# Patient Record
Sex: Male | Born: 1970 | Race: Black or African American | Hispanic: No | Marital: Married | Smoking: Never smoker
Health system: Southern US, Community
[De-identification: ages and names within clinical notes are randomized; demographics above are authoritative.]

## PROBLEM LIST (undated history)

## (undated) DIAGNOSIS — F329 Major depressive disorder, single episode, unspecified: Secondary | ICD-10-CM

## (undated) DIAGNOSIS — J309 Allergic rhinitis, unspecified: Secondary | ICD-10-CM

## (undated) DIAGNOSIS — F32A Depression, unspecified: Secondary | ICD-10-CM

## (undated) DIAGNOSIS — E781 Pure hyperglyceridemia: Secondary | ICD-10-CM

## (undated) HISTORY — DX: Allergic rhinitis, unspecified: J30.9

## (undated) HISTORY — DX: Pure hyperglyceridemia: E78.1

## (undated) HISTORY — DX: Major depressive disorder, single episode, unspecified: F32.9

## (undated) HISTORY — DX: Depression, unspecified: F32.A

---

## 2008-12-31 ENCOUNTER — Ambulatory Visit: Payer: Self-pay | Admitting: Orthopedic Surgery

## 2009-02-17 ENCOUNTER — Ambulatory Visit: Payer: Self-pay | Admitting: Orthopedic Surgery

## 2009-02-24 ENCOUNTER — Ambulatory Visit: Payer: Self-pay | Admitting: Orthopedic Surgery

## 2009-07-31 HISTORY — PX: SHOULDER SURGERY: SHX246

## 2011-09-03 ENCOUNTER — Ambulatory Visit: Payer: Self-pay

## 2013-06-01 ENCOUNTER — Encounter (HOSPITAL_COMMUNITY): Payer: Self-pay | Admitting: Emergency Medicine

## 2013-06-01 ENCOUNTER — Emergency Department (INDEPENDENT_AMBULATORY_CARE_PROVIDER_SITE_OTHER)
Admission: EM | Admit: 2013-06-01 | Discharge: 2013-06-01 | Disposition: A | Discharge status: Home / Self Care | Payer: 59 | Source: Home / Self Care | Attending: Emergency Medicine | Admitting: Emergency Medicine

## 2013-06-01 DIAGNOSIS — H579 Unspecified disorder of eye and adnexa: Secondary | ICD-10-CM

## 2013-06-01 MED ORDER — POLYMYXIN B-TRIMETHOPRIM 10000-0.1 UNIT/ML-% OP SOLN: 1.0000 [drp] | OPHTHALMIC | Status: DC

## 2013-06-01 MED ORDER — TETRACAINE HCL 0.5 % OP SOLN
OPHTHALMIC | Status: AC
  Filled 2013-06-01: qty 2

## 2013-06-01 NOTE — ED Notes (Signed)
Pt  Reports        r  Eye  Irritated          For  sev  Days           The    Eye  Is       Irritated       Red   And  Watery

## 2013-06-01 NOTE — ED Provider Notes (Signed)
Chief Complaint:   Chief Complaint  Patient presents with  . 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 Ever since then it's been red, watering, irritated, and he feels a sensation of a foreign body. He denies any change in his vision. There's been no purulent drainage.  Review of Systems:  Other than noted above, the patient denies any of the following symptoms: Systemic:  No fever, chills, sweats, fatigue, or weight loss. Eye:  No redness, eye pain, photophobia, discharge, blurred vision, or diplopia. ENT:  No nasal congestion, rhinorrhea, or sore throat. Lymphatic:  No adenopathy. Skin:  No rash or pruritis.  PMFSH:  Past medical history, family history, social history, meds, and allergies were reviewed.   Physical Exam:   Vital signs:  BP 142/97  Pulse 69  Temp(Src) 98.1 F (36.7 C) (Oral)  Resp 19  SpO2 97% General:  Alert and in no distress. Eye:  His eye lids were normal. Conjunctival sac was searched for foreign body none was found. The upper lid was everted and this was normal. Conjunctiva was injected. There was copious clear drainage, but no purulent drainage. The cornea was intact to gross inspection and to fluorescein staining. Anterior chamber was normal. PERRLA, full EOMs, fundi were benign. ENT:  TMs and canals clear.  Nasal mucosa normal.  No intra-oral lesions, mucous membranes moist, pharynx clear. Neck:  No adenopathy tenderness or mass. Skin:  Clear, warm and dry.  Assessment:  The encounter diagnosis was Sensation of foreign body in eye.  I think he may have had a foreign body in his eye but appears flushed it out. At this point the eyes irritated. Will treat with eye drops, moist warm compresses, and followup with his ophthalmologist if no better in 48 hours.  Plan:   1.  Meds:  The following meds were  prescribed:   Discharge Medication List as of 06/01/2013  9:33 AM    START taking these medications   Details  trimethoprim-polymyxin b (POLYTRIM) ophthalmic solution Place 1 drop into the right eye every 4 (four) hours., Starting 06/01/2013, Until Discontinued, Normal        2.  Patient Education/Counseling:  The patient was given appropriate handouts, self care instructions, and instructed in symptomatic relief.  Advised to avoid rubbing the eyes, avoid eye strain, and use sunglasses to avoid bright sunlight.  3.  Follow up:  The patient was told to follow up if no better in 2 days, if becoming worse in any way, and given some red flag symptoms such as any changes in vision which would prompt immediate return.  Follow up with his ophthalmologist if no better in 48 hours.      Reuben Likes, MD 06/01/13 1041

## 2014-04-16 ENCOUNTER — Ambulatory Visit: Payer: Self-pay | Admitting: Family Medicine

## 2015-05-18 ENCOUNTER — Ambulatory Visit (INDEPENDENT_AMBULATORY_CARE_PROVIDER_SITE_OTHER): Payer: 59 | Admitting: Family Medicine

## 2015-05-18 ENCOUNTER — Encounter: Payer: Self-pay | Admitting: Family Medicine

## 2015-05-18 VITALS — BP 124/88 | HR 70 | Temp 98.8°F | Resp 16 | Ht 71.0 in | Wt 219.8 lb

## 2015-05-18 DIAGNOSIS — E781 Pure hyperglyceridemia: Secondary | ICD-10-CM | POA: Insufficient documentation

## 2015-05-18 DIAGNOSIS — Z87898 Personal history of other specified conditions: Secondary | ICD-10-CM | POA: Diagnosis not present

## 2015-05-18 DIAGNOSIS — K529 Noninfective gastroenteritis and colitis, unspecified: Secondary | ICD-10-CM | POA: Insufficient documentation

## 2015-05-18 DIAGNOSIS — J309 Allergic rhinitis, unspecified: Secondary | ICD-10-CM | POA: Insufficient documentation

## 2015-05-18 NOTE — Progress Notes (Signed)
Subjective:     Patient ID: Aaron Santos, male   DOB: 06/08/1971, 44 y.o.   MRN: [id number]  HPI  Chief Complaint  Patient presents with  . Skin Problem    Patient comes in office today with concerns of nodule/lump on the left side of his neck. Patient repots that he did not notice lump but others have pointed it out to him and it has been present for one week. Patient denies cold/flu like symptoms or injury to neck.   States he has not noticed a lump himself. Denies dental sx but is due a dental exam. He does shave his head daily.   Review of Systems  Constitutional: Negative for fever and chills.       Objective:   Physical Exam  Constitutional: He appears well-developed and well-nourished. No distress.  Neck: No thyromegaly present.  No cysts or masses appreciated.  Lymphadenopathy:    He has no cervical adenopathy.       Assessment:    1. History of neck swelling     Plan:    Monitor. Discussed possibility of reactive lymph nodes or early cyst formation. Will update dental exam.

## 2015-05-18 NOTE — Patient Instructions (Addendum)
Discussed monitoring for further neck swelling or lumps.

## 2015-12-11 IMAGING — CT CT ABD-PELV W/O CM
2 of 4 series · 16 of 46 positions shown, 18 images · non-contrast
Comparison: None.

CLINICAL DATA: Right lower quadrant pain, hematuria

EXAM:
CT ABDOMEN AND PELVIS WITHOUT CONTRAST
TECHNIQUE: Multidetector CT imaging of the abdomen and pelvis was performed
following the standard protocol without IV contrast.

[Series 2: stone standard full · axial · 0.68mm/px · z∈[-892,-432]mm · 13 of 101 slices shown, 15 images]
[im 5/101  soft-tissue]
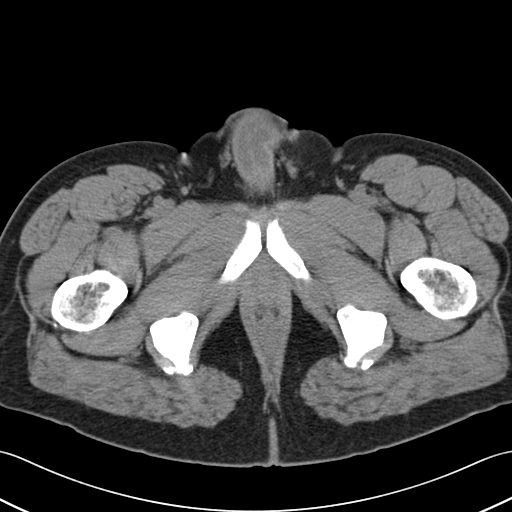
[im 5/101  bone]
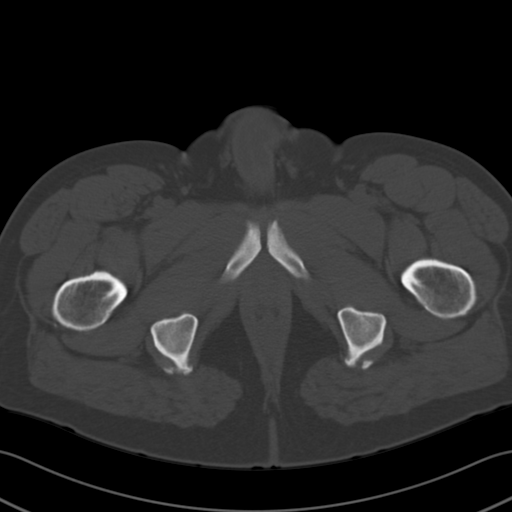
[im 13/101  soft-tissue]
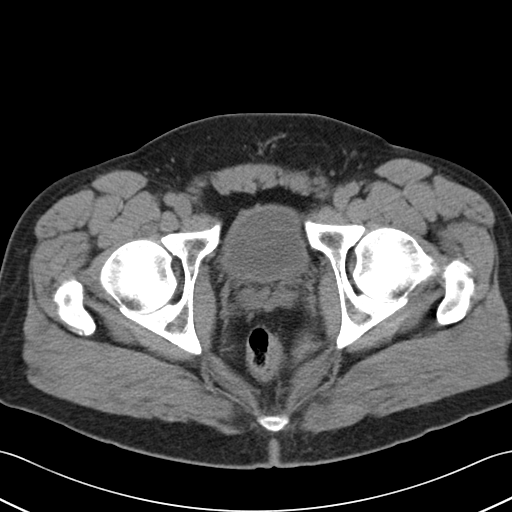
[im 21/101  soft-tissue]
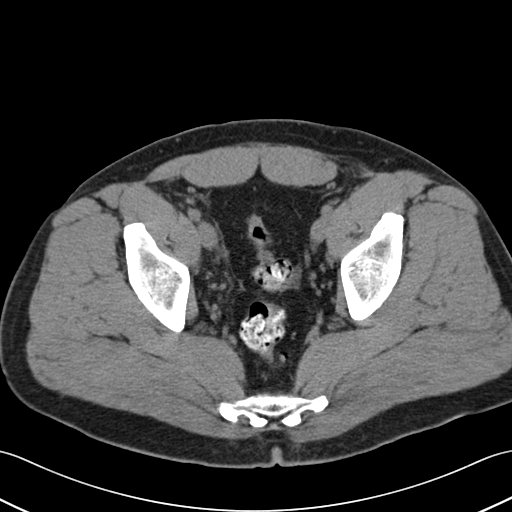
[im 29/101  soft-tissue]
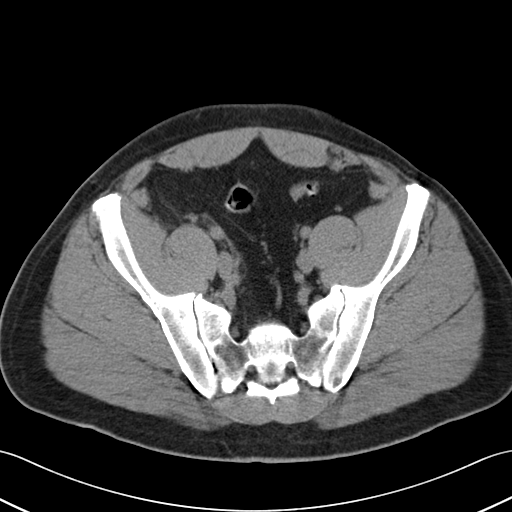
[im 37/101  soft-tissue]
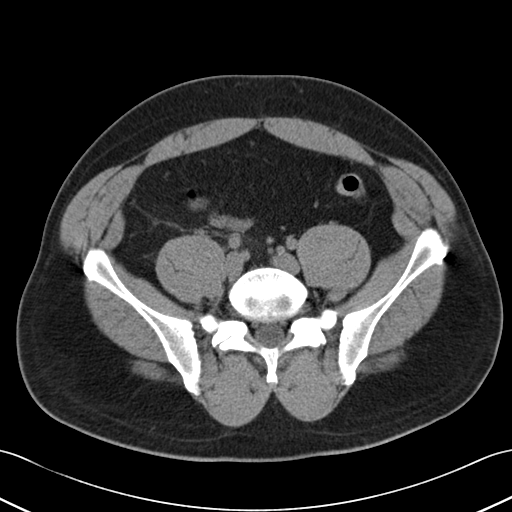
[im 45/101  soft-tissue]
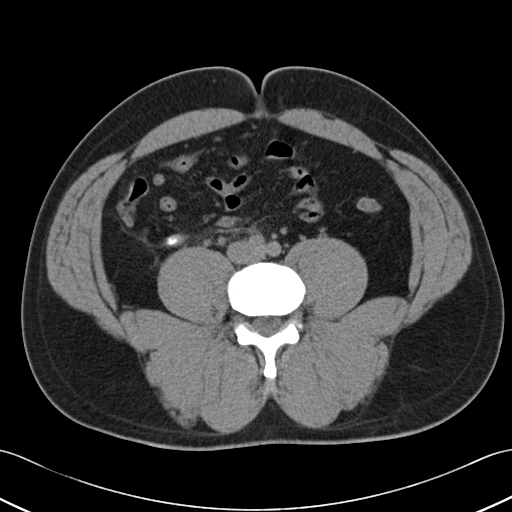
[im 53/101  soft-tissue]
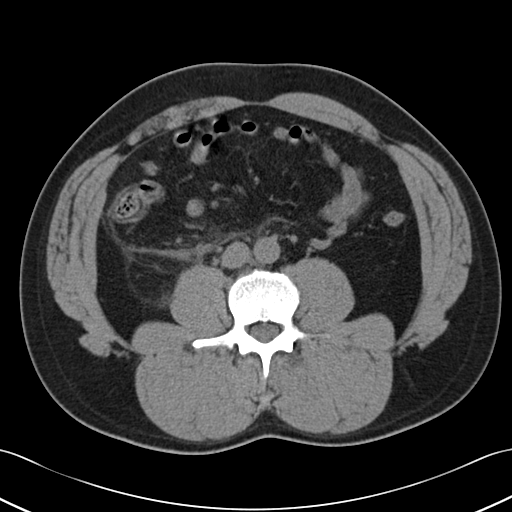
[im 57/101  soft-tissue]
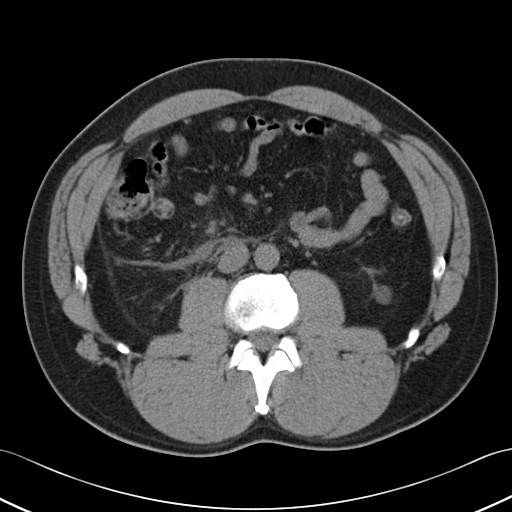
[im 65/101  soft-tissue]
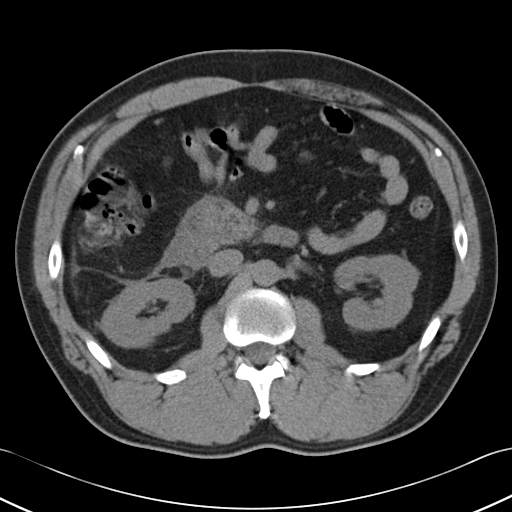
[im 65/101  bone]
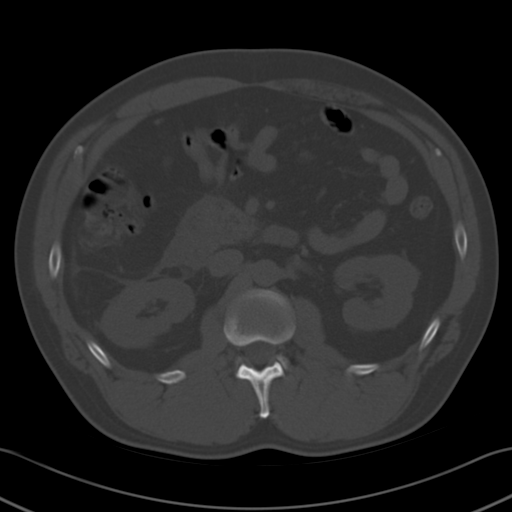
[im 73/101  soft-tissue]
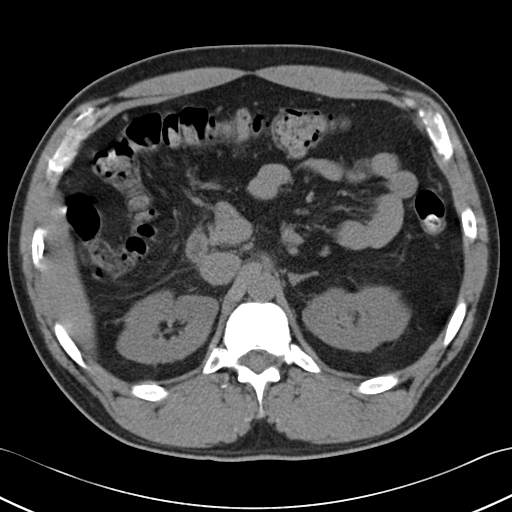
[im 81/101  soft-tissue]
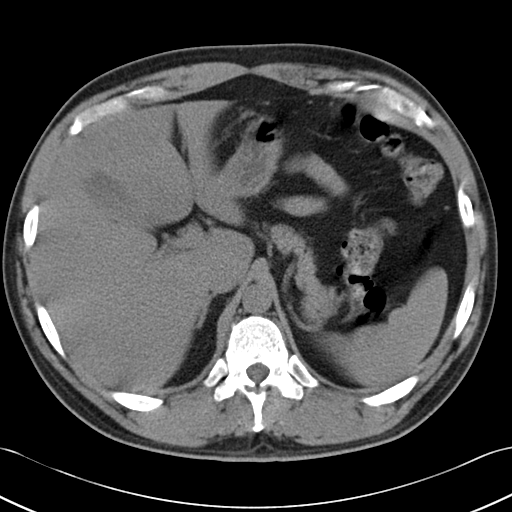
[im 89/101  soft-tissue]
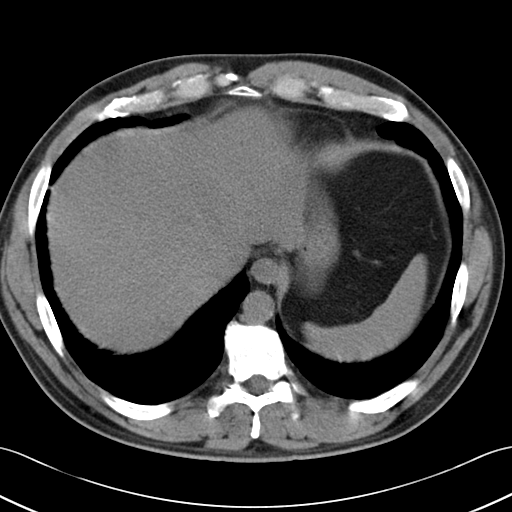
[im 97/101  soft-tissue]
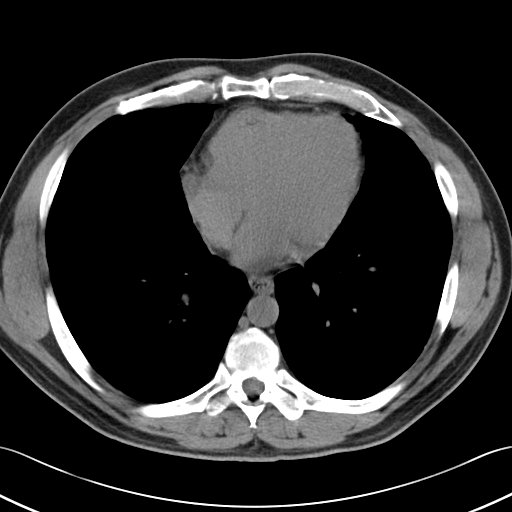

[Series 5: cor stone standard full · coronal · 0.84mm/px · 3 of 137 slices shown]
[im 46/137  soft-tissue]
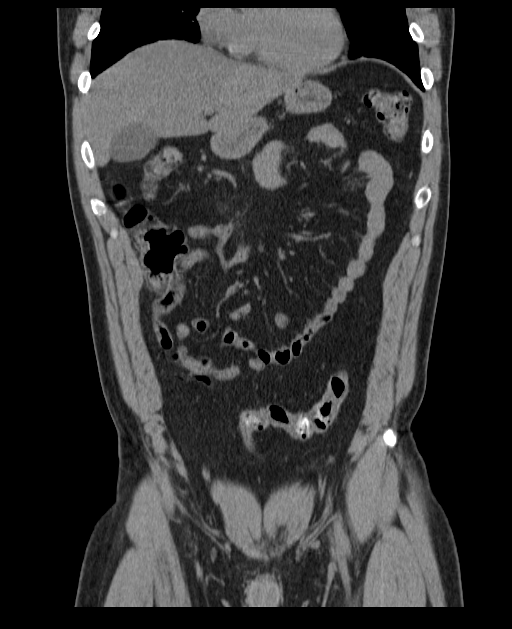
[im 61/137  soft-tissue]
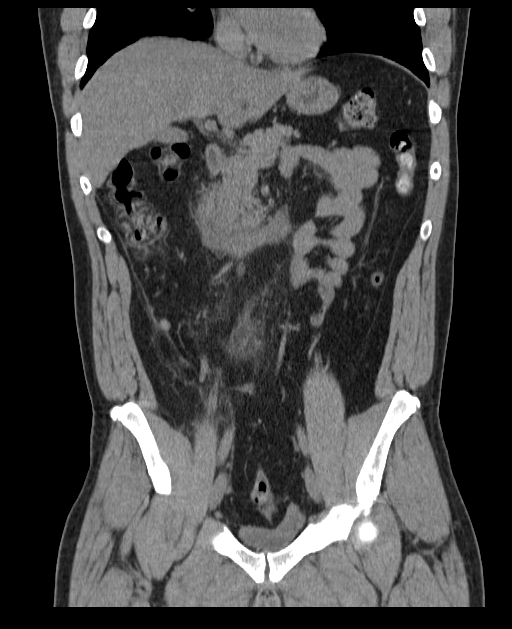
[im 76/137  soft-tissue]
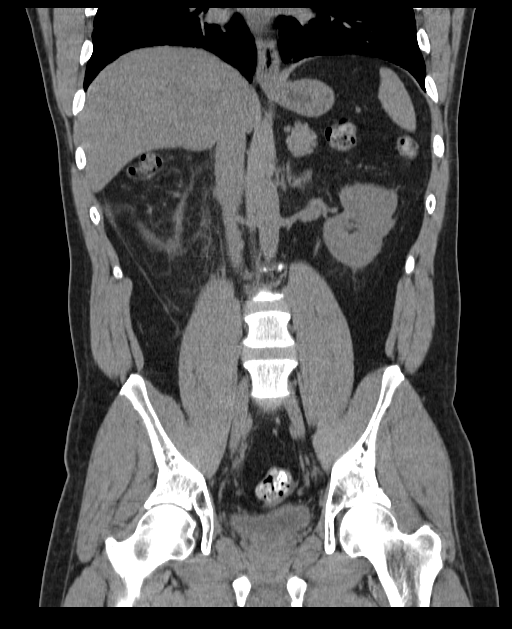

[16 of 46 positions shown; findings below may reference images not displayed]

FINDINGS: Indistinct slightly prominent markings are present in both lung
bases which may be chronic in nature. No focal infiltrate or
effusion is seen. No focal hepatic abnormality is seen. The liver is
slightly low in attenuation which may indicate fatty infiltration.
Correlate with liver function tests. No calcified gallstones are
seen. The pancreas is slightly prominent in the region of the head
of the pancreas and there is some peripancreatic strandiness present
with thickening of the anterior pararenal fascia. These findings
suggest possible mild acute pancreatitis, and correlation with
appropriate laboratory values is recommended. The body and tail of
the pancreas are unremarkable and the pancreatic duct is not
dilated.

No renal calculi are seen and there is no evidence of
hydronephrosis. The ureters are not dilated and no ureteral calculus
is noted. A probable cyst emanates from the lower pole of the left
kidney exophytically. The abdominal aorta is normal in caliber. No
adenopathy is seen.

There is an appendicolith noted within the appendix which does
coarse near the somewhat thickened anterior pararenal fascia.
However the appendix is not dilated and no definite acute
appendicitis is evident. The terminal ileum is unremarkable. The
urinary bladder is decompressed. The prostate is within normal
limits in size. The colon is largely decompressed. There is a tiny
amount of free fluid layering in the pelvis. The lumbar vertebrae
are in normal alignment with normal intervertebral disc spaces.
IMPRESSION: 1. Prominence of the pancreatic head with surrounding inflammatory
change suggests mild acute pancreatitis. Correlate with appropriate
laboratory values. No pancreatic ductal dilatation is seen.
2. No renal or ureteral calculi are noted.
3. Incidental appendicolith within the appendix is noted with no
present evidence of acute appendicitis.
4. Possible mild fatty infiltration of the liver. Correlate with
LFTs.

## 2016-02-29 ENCOUNTER — Ambulatory Visit (HOSPITAL_COMMUNITY)
Admission: EM | Admit: 2016-02-29 | Discharge: 2016-02-29 | Disposition: A | Discharge status: Home / Self Care | Payer: 59 | Attending: Family Medicine | Admitting: Family Medicine

## 2016-02-29 ENCOUNTER — Encounter (HOSPITAL_COMMUNITY): Payer: Self-pay | Admitting: Emergency Medicine

## 2016-02-29 DIAGNOSIS — M436 Torticollis: Secondary | ICD-10-CM | POA: Diagnosis not present

## 2016-02-29 MED ORDER — CYCLOBENZAPRINE HCL 5 MG PO TABS
5.0000 mg | ORAL_TABLET | Freq: Three times a day (TID) | ORAL | 0 refills | Status: DC
Start: 2016-02-29 — End: 2016-09-04

## 2016-02-29 MED ORDER — DICLOFENAC POTASSIUM 50 MG PO TABS: 50.0000 mg | ORAL_TABLET | Freq: Three times a day (TID) | ORAL | 0 refills | Status: DC

## 2016-02-29 NOTE — ED Provider Notes (Signed)
[facility]    CSN: [id number] Arrival date & time: 02/29/16  1409  First Provider Contact:  First MD Initiated Contact with Patient 02/29/16 1527        History   Chief Complaint No chief complaint on file.   HPI Aaron Santos is a 45 y.o. male.    Neck Injury  This is a new problem. The current episode started 6 to 12 hours ago. The problem occurs constantly. The problem has not changed since onset.Pertinent negatives include no chest pain, no abdominal pain and no headaches. The symptoms are aggravated by bending.    Past Medical History:  Diagnosis Date  . Allergic rhinitis   . Depression   . Hypertriglyceridemia     Patient Active Problem List   Diagnosis Date Noted  . Allergic rhinitis 05/18/2015  . Hypertriglyceridemia 05/18/2015    Past Surgical History:  Procedure Laterality Date  . SHOULDER SURGERY  2011       Home Medications    Prior to Admission medications   Not on File    Family History Family History  Problem Relation Age of Onset  . Alcohol abuse Father   . Cancer Father     liver  . Drug abuse Father   . Diabetes Father   . Healthy Daughter   . Healthy Son   . Healthy Daughter     Social History Social History  Substance Use Topics  . Smoking status: Never Smoker  . Smokeless tobacco: Not on file  . Alcohol use Yes     Allergies   Review of patient's allergies indicates no known allergies.   Review of Systems Review of Systems  Constitutional: Negative.   Cardiovascular: Negative for chest pain.  Gastrointestinal: Negative for abdominal pain.  Musculoskeletal: Positive for neck pain and neck stiffness.  Skin: Negative.   Neurological: Negative for weakness, numbness and headaches.  All other systems reviewed and are negative.    Physical Exam Triage Vital Signs ED Triage Vitals [02/29/16 1519]  Enc Vitals Group     BP 135/86     Pulse Rate 66     Resp 16     Temp 98.4 F (36.9 C)     Temp  Source Oral     SpO2 97 %     Weight      Height      Head Circumference      Peak Flow      Pain Score      Pain Loc      Pain Edu?      Excl. in GC?    No data found.   Updated Vital Signs BP 135/86 (BP Location: Left Arm)   Pulse 66   Temp 98.4 F (36.9 C) (Oral)   Resp 16   SpO2 97%   Visual Acuity Right Eye Distance:   Left Eye Distance:   Bilateral Distance:    Right Eye Near:   Left Eye Near:    Bilateral Near:     Physical Exam  Constitutional: He appears well-developed and well-nourished. He appears distressed.  Pt exercises reg., no neuro findings.  Neck: Trachea normal. Muscular tenderness present. No spinous process tenderness present. Carotid bruit is not present. No neck rigidity. No edema and no erythema present. No Brudzinski's sign and no Kernig's sign noted. No thyromegaly present.    Musculoskeletal: Normal range of motion.  Lymphadenopathy:    He has no cervical adenopathy.  Skin: Skin is warm and  dry.  Nursing note and vitals reviewed.    UC Treatments / Results  Labs (all labs ordered are listed, but only abnormal results are displayed) Labs Reviewed - No data to display  EKG  EKG Interpretation None       Radiology No results found.  Procedures Procedures (including critical care time)  Medications Ordered in UC Medications - No data to display   Initial Impression / Assessment and Plan / UC Course  I have reviewed the triage vital signs and the nursing notes.  Pertinent labs & imaging results that were available during my care of the patient were reviewed by me and considered in my medical decision making (see chart for details).  Clinical Course      Final Clinical Impressions(s) / UC Diagnoses   Final diagnoses:  None    New Prescriptions New Prescriptions   No medications on file     Linna Hoff, MD 02/29/16 1550

## 2016-02-29 NOTE — ED Triage Notes (Signed)
Upper back pain and neck pain, woke with pain this morning.  No injury.

## 2016-02-29 NOTE — Discharge Instructions (Signed)
Heat, stretch and medicine, return as needed.

## 2016-09-04 ENCOUNTER — Ambulatory Visit (INDEPENDENT_AMBULATORY_CARE_PROVIDER_SITE_OTHER): Payer: 59 | Admitting: Family Medicine

## 2016-09-04 ENCOUNTER — Encounter: Payer: Self-pay | Admitting: Family Medicine

## 2016-09-04 VITALS — BP 124/88 | HR 65 | Temp 98.3°F | Resp 16 | Wt 215.2 lb

## 2016-09-04 DIAGNOSIS — J0100 Acute maxillary sinusitis, unspecified: Secondary | ICD-10-CM

## 2016-09-04 MED ORDER — AMOXICILLIN-POT CLAVULANATE 875-125 MG PO TABS: 1.0000 | ORAL_TABLET | Freq: Two times a day (BID) | ORAL | 0 refills | Status: DC

## 2016-09-04 NOTE — Patient Instructions (Signed)
Discussed trying Mucinex D for congestion.

## 2016-09-04 NOTE — Progress Notes (Signed)
Subjective:     Patient ID: Aaron Santos, male   DOB: 08/29/1970, 46 y.o.   MRN: [id number]  HPI  Chief Complaint  Patient presents with  . Sinus Problem    Patient comes in office into office today with complaints of sinus pain and pressure for the past 2 weeks. Patient reports taking otc Mucinex and Tylenol Sinus.   Patient reports increased sinus pressure, purulent sinus drainage, post nasal drainage and accompanying cough   Review of Systems     Objective:   Physical Exam  Constitutional: He appears well-developed and well-nourished. No distress.  Ears: T.M's intact without inflammation Sinuses:moderate maxillary sinus tenderness Throat: no tonsillar enlargement or exudate Neck: no cervical adenopathy Lungs: clear     Assessment:    1. Acute non-recurrent maxillary sinusitis - amoxicillin-clavulanate (AUGMENTIN) 875-125 MG tablet; Take 1 tablet by mouth 2 (two) times daily.  Dispense: 20 tablet; Refill: 0    Plan:    Discussed use of Mucinex D

## 2016-10-30 ENCOUNTER — Ambulatory Visit (INDEPENDENT_AMBULATORY_CARE_PROVIDER_SITE_OTHER): Payer: 59 | Admitting: Family Medicine

## 2016-10-30 ENCOUNTER — Encounter: Payer: Self-pay | Admitting: Family Medicine

## 2016-10-30 VITALS — BP 146/102 | HR 64 | Temp 98.4°F | Resp 16 | Ht 70.0 in | Wt 211.0 lb

## 2016-10-30 DIAGNOSIS — R030 Elevated blood-pressure reading, without diagnosis of hypertension: Secondary | ICD-10-CM

## 2016-10-30 DIAGNOSIS — Z0000 Encounter for general adult medical examination without abnormal findings: Secondary | ICD-10-CM

## 2016-10-30 NOTE — Progress Notes (Signed)
Subjective:     Patient ID: Aaron Santos, male   DOB: [birth year], 46 y.o.   MRN: [id number]  HPI  Chief Complaint  Patient presents with  . Annual Exam    Patient comes in office today for his annual physical he states that he has no questions or concerns today and is feeling well. Patient reports that he exercises 3-4x a week, following a well balance diet, sleeping on average 5-7hrs a night waking up frequently. Patient states that his libido is normal, he is due today for flu vaccine but has declined. Patient last Tdap vaccine was 07/28/14.   Currently working as a Occupational hygienist for [employer]. Paternal hx of hypertension but no colon/prostate cancer reported.   Review of Systems General: Feeling well, reports occasionally not sleeping well "my mind is racing". Has trouble getting focused at work for the first part of his day. Does report snoring. HEENT: regular dental visits/ pending eye exam (reading glasses). Cardiovascular: no chest pain, shortness of breath, or palpitations GI: no heartburn, no change in bowel habits or blood in the stool GU: nocturia x 0, no change in bladder habits  Psychiatric: not depressed Musculoskeletal: ankle stiffness in the AM he attributes to prior [employer].    Objective:   Physical Exam  Constitutional: He appears well-developed and well-nourished. No distress.  Eyes: PERRLA Neck: no thyromegaly, tenderness or nodules, no cervical adenopathy ENT: TM's intact without inflammation; No tonsillar enlargement or exudate, Lungs: Clear Heart : RRR without murmur or gallop Abd: bowel sounds present, soft, non-tender, no organomegaly Extremities: no edema     Assessment:    1. Annual physical exam - Comprehensive metabolic panel - Lipid panel  2. Elevated BP without diagnosis of hypertension    Plan:    Discussed nurse bp checks over the next 1-2 weeks. Epworth screen due to snoring and poor AM focus/concentration. Consider sleep aid.

## 2016-10-30 NOTE — Patient Instructions (Signed)
We will call you with the lab results. Please come on for a nurse blood pressure check on at least two occasions over the next  1-2 weeks. Return the Epworth sleep apnea screen when you are done.

## 2016-10-31 ENCOUNTER — Telehealth: Payer: Self-pay

## 2016-10-31 LAB — COMPREHENSIVE METABOLIC PANEL
A/G RATIO: 1.4 (ref 1.2–2.2)
ALT: 23 IU/L (ref 0–44)
AST: 23 IU/L (ref 0–40)
Albumin: 4 g/dL (ref 3.5–5.5)
Alkaline Phosphatase: 63 IU/L (ref 39–117)
BILIRUBIN TOTAL: 0.5 mg/dL (ref 0.0–1.2)
BUN / CREAT RATIO: 9 (ref 9–20)
BUN: 10 mg/dL (ref 6–24)
CO2: 25 mmol/L (ref 18–29)
Calcium: 8.8 mg/dL (ref 8.7–10.2)
Chloride: 100 mmol/L (ref 96–106)
Creatinine, Ser: 1.12 mg/dL (ref 0.76–1.27)
GFR calc non Af Amer: 78 mL/min/{1.73_m2} (ref >59)
GFR, EST AFRICAN AMERICAN: 91 mL/min/{1.73_m2} (ref >59)
Globulin, Total: 2.9 g/dL (ref 1.5–4.5)
Glucose: 112 mg/dL — ABNORMAL HIGH (ref 65–99)
POTASSIUM: 4.4 mmol/L (ref 3.5–5.2)
Sodium: 141 mmol/L (ref 134–144)
Total Protein: 6.9 g/dL (ref 6.0–8.5)

## 2016-10-31 LAB — LIPID PANEL
CHOL/HDL RATIO: 6.7 ratio — AB (ref 0.0–5.0)
Cholesterol, Total: 301 mg/dL — ABNORMAL HIGH (ref 100–199)
HDL: 45 mg/dL (ref >39)
Triglycerides: 546 mg/dL — ABNORMAL HIGH (ref 0–149)

## 2016-10-31 NOTE — Telephone Encounter (Signed)
-----   Message from Anola Gurney, [place] sent at 10/31/2016  7:36 AM EDT ----- Cholesterol is high but will need to add another lab to get your LDL as triglycerides are too high to calculate this (Kat-please add direct LDL to current labs) Sugar is mildly elevated.

## 2016-10-31 NOTE — Telephone Encounter (Signed)
[id number] 

## 2016-11-01 NOTE — Telephone Encounter (Signed)
-----   Message from Anola Gurney, [place] sent at 11/01/2016  7:54 AM EDT ----- Your triglycerides are elevated as previously with mild LDL (bad cholesterol) elevation. However your 10 year calculated risk for developing cardiovascular disease is 6.4% below the 7.5% risk when we usually start cholesterol lowering medication. When your blood pressure is controlled your risk drops further. Would focus on low fat food choices to lower triglycerides and cholesterol along with regular exercise. We will see if we have to start bp medication in the next week or two depending on your readings.

## 2016-11-01 NOTE — Telephone Encounter (Signed)
Pt advised-aa 

## 2016-11-01 NOTE — Telephone Encounter (Signed)
Unable to reach patient at this time home/cell phone is turned off and voicemail box is now full, will try contacting patient again later this evening. KW

## 2016-11-02 LAB — SPECIMEN STATUS REPORT

## 2016-11-02 LAB — LDL CHOLESTEROL, DIRECT: LDL Direct: 147 mg/dL — ABNORMAL HIGH (ref 0–99)

## 2016-12-27 ENCOUNTER — Encounter (HOSPITAL_COMMUNITY): Payer: Self-pay | Admitting: Family Medicine

## 2016-12-27 ENCOUNTER — Ambulatory Visit (HOSPITAL_COMMUNITY)
Admission: EM | Admit: 2016-12-27 | Discharge: 2016-12-27 | Disposition: A | Discharge status: Home / Self Care | Payer: 59 | Attending: Internal Medicine | Admitting: Internal Medicine

## 2016-12-27 DIAGNOSIS — T700XXA Otitic barotrauma, initial encounter: Secondary | ICD-10-CM

## 2016-12-27 DIAGNOSIS — J301 Allergic rhinitis due to pollen: Secondary | ICD-10-CM | POA: Diagnosis not present

## 2016-12-27 DIAGNOSIS — H6983 Other specified disorders of Eustachian tube, bilateral: Secondary | ICD-10-CM | POA: Diagnosis not present

## 2016-12-27 DIAGNOSIS — R0982 Postnasal drip: Secondary | ICD-10-CM

## 2016-12-27 NOTE — ED Provider Notes (Signed)
CSN: [id number]     Arrival date & time 12/27/16  1104 History   First MD Initiated Contact with Patient 12/27/16 1211     Chief Complaint  Patient presents with  . Nasal Congestion  . Headache   (Consider location/radiation/quality/duration/timing/severity/associated sxs/prior Treatment) Social male with sinus congestion, cough, copious PND, since of choking at nighttime, occasional tinnitus and occasional mild intermittent headache. Denies sore throat or earache. Denies fever or chills. He is taking Mucinex only.      Past Medical History:  Diagnosis Date  . Allergic rhinitis   . Depression   . Hypertriglyceridemia    Past Surgical History:  Procedure Laterality Date  . SHOULDER SURGERY  2011   Family History  Problem Relation Age of Onset  . Alcohol abuse Father   . Cancer Father        liver  . Drug abuse Father   . Diabetes Father   . Healthy Daughter   . Healthy Son   . Healthy Daughter    Social History  Substance Use Topics  . Smoking status: Never Smoker  . Smokeless tobacco: Never Used  . Alcohol use Yes    Review of Systems  Constitutional: Positive for activity change. Negative for appetite change, diaphoresis, fatigue and fever.  HENT: Positive for congestion, postnasal drip and tinnitus. Negative for ear pain, facial swelling, rhinorrhea, sore throat and trouble swallowing.   Eyes: Negative for pain, discharge and redness.  Respiratory: Positive for cough. Negative for chest tightness and shortness of breath.   Cardiovascular: Negative.   Gastrointestinal: Negative.   Musculoskeletal: Negative.  Negative for neck pain and neck stiffness.  Neurological: Negative.   All other systems reviewed and are negative.   Allergies  Patient has no known allergies.  Home Medications   Prior to Admission medications   Not on File   Meds Ordered and Administered this Visit  Medications - No data to display  BP (!) 128/97   Pulse 65   Temp 98.2 F  (36.8 C) (Oral)   Resp 16   SpO2 98%  No data found.   Physical Exam  Constitutional: He is oriented to person, place, and time. He appears well-developed and well-nourished. No distress.  HENT:  Mouth/Throat: No oropharyngeal exudate.  Bilateral TMs are moderately retracted. Minor erythema to the left TM. No effusion. No drainage. Oropharynx with minor erythema, mild cobblestone and large amount of thick primarily clear mucus/PND.  Eyes: EOM are normal.  Neck: Normal range of motion. Neck supple.  Cardiovascular: Normal rate, regular rhythm and normal heart sounds.   Pulmonary/Chest: Effort normal and breath sounds normal. No respiratory distress. He has no wheezes. He has no rales.  Musculoskeletal: Normal range of motion. He exhibits no edema.  Lymphadenopathy:    He has no cervical adenopathy.  Neurological: He is alert and oriented to person, place, and time.  Skin: Skin is warm and dry. No rash noted.  Psychiatric: He has a normal mood and affect.  Nursing note and vitals reviewed.   Urgent Care Course     Procedures (including critical care time)  Labs Review Labs Reviewed - No data to display  Imaging Review No results found.   Visual Acuity Review  Right Eye Distance:   Left Eye Distance:   Bilateral Distance:    Right Eye Near:   Left Eye Near:    Bilateral Near:         MDM   1. Seasonal allergic rhinitis due to  pollen   2. PND (post-nasal drip)   3. ETD (Eustachian tube dysfunction), bilateral   4. Barotitis media, initial encounter    Your exam and history does not suggest an infection rather allergies. The below medications and instructions should health with your symptoms. At this time I would avoid taking the Mucinex as you already have plenty of drainage and this can make it worse. Sudafed PE 10 mg every 4 to 6 hours as needed for congestion Allegra or Zyrtec daily as needed for drainage and runny nose. For stronger antihistamine may take  Chlor-Trimeton 2 to 4 mg every 4 to 6 hours, may cause drowsiness. Saline nasal spray used frequently. Ibuprofen 600 mg every 6 hours as needed for pain, discomfort or fever. Drink plenty of fluids and stay well-hydrated. Flonase or Rhinocort nasal spray daily     Hayden Rasmussen, NP 12/27/16 1225

## 2016-12-27 NOTE — ED Triage Notes (Signed)
Pt here for URI symptoms x 1 week.  

## 2016-12-27 NOTE — Discharge Instructions (Signed)
Your exam and history does not suggest an infection rather allergies. The below medications and instructions should health with your symptoms. At this time I would avoid taking the Mucinex as you already have plenty of drainage and this can make it worse. Sudafed PE 10 mg every 4 to 6 hours as needed for congestion Allegra or Zyrtec daily as needed for drainage and runny nose. For stronger antihistamine may take Chlor-Trimeton 2 to 4 mg every 4 to 6 hours, may cause drowsiness. Saline nasal spray used frequently. Ibuprofen 600 mg every 6 hours as needed for pain, discomfort or fever. Drink plenty of fluids and stay well-hydrated. Flonase or Rhinocort nasal spray daily

## 2017-08-02 ENCOUNTER — Encounter (HOSPITAL_COMMUNITY): Payer: Self-pay | Admitting: Emergency Medicine

## 2017-08-02 ENCOUNTER — Other Ambulatory Visit: Payer: Self-pay

## 2017-08-02 ENCOUNTER — Ambulatory Visit (HOSPITAL_COMMUNITY)
Admission: EM | Admit: 2017-08-02 | Discharge: 2017-08-02 | Disposition: A | Discharge status: Home / Self Care | Payer: 59 | Attending: Physician Assistant | Admitting: Physician Assistant

## 2017-08-02 DIAGNOSIS — I10 Essential (primary) hypertension: Secondary | ICD-10-CM

## 2017-08-02 MED ORDER — HYDROCHLOROTHIAZIDE 25 MG PO TABS: 25.0000 mg | ORAL_TABLET | Freq: Every day | ORAL | 0 refills | Status: AC

## 2017-08-02 NOTE — ED Provider Notes (Signed)
[facility]    CSN: [id number] Arrival date & time: 08/02/17  1001     History   Chief Complaint Chief Complaint  Patient presents with  . Hypertension    HPI Aaron Santos is a 47 y.o. male.   The history is provided by the patient. No language interpreter was used.  Hypertension  This is a new problem. The current episode started more than 1 week ago. The problem occurs constantly. The problem has been gradually worsening. Pertinent negatives include no chest pain and no headaches. Nothing aggravates the symptoms. Nothing relieves the symptoms. He has tried nothing for the symptoms. The treatment provided no relief.  Pt reports his blood pressure has been elevated when he sees his primary.  Pt concerned because he has felt dizzy with standing recently.  [facility]  Past [facility] History:  Diagnosis Date  . Allergic rhinitis   . Depression   . Hypertriglyceridemia     Patient Active Problem List   Diagnosis Date Noted  . Allergic rhinitis 05/18/2015  . Hypertriglyceridemia 05/18/2015    Past Surgical History:  Procedure Laterality Date  . SHOULDER SURGERY  2011       Home Medications    Prior to Admission medications   Medication Sig Start Date End Date Taking? Authorizing Provider  hydrochlorothiazide (HYDRODIURIL) 25 MG tablet Take 1 tablet (25 mg total) by mouth daily. 08/02/17   Elson Areas, PA-C    Family History Family History  Problem Relation Age of Onset  . Alcohol abuse Father   . Cancer Father        liver  . Drug abuse Father   . Diabetes Father   . Hypertension Father   . Healthy Daughter   . Healthy Son   . Healthy Daughter     Social History Social History   Tobacco Use  . Smoking status: Never Smoker  . Smokeless tobacco: Never Used  Substance Use Topics  . Alcohol use: Yes  . Drug use: No     Allergies   Patient has no known allergies.   Review of Systems Review of Systems    Cardiovascular: Negative for chest pain.  Neurological: Negative for headaches.  All other systems reviewed and are negative.    Physical Exam Triage Vital Signs ED Triage Vitals [08/02/17 1015]  Enc Vitals Group     BP (!) 150/109     Pulse Rate 68     Resp      Temp 98.1 F (36.7 C)     Temp Source Oral     SpO2 97 %     Weight      Height      Head Circumference      Peak Flow      Pain Score      Pain Loc      Pain Edu?      Excl. in GC?    Orthostatic VS for the past 24 hrs:  BP- Lying Pulse- Lying BP- Sitting Pulse- Sitting BP- Standing at 0 minutes Pulse- Standing at 0 minutes  08/02/17 1032 (!) 156/93 63 (!) 151/106 66 (!) 149/97 69    Updated Vital Signs BP (!) 150/109 (BP Location: Right Arm)   Pulse 68   Temp 98.1 F (36.7 C) (Oral)   SpO2 97%   Visual Acuity Right Eye Distance:   Left Eye Distance:   Bilateral Distance:    Right Eye Near:   Left  Eye Near:    Bilateral Near:     Physical Exam  Constitutional: He appears well-developed and well-nourished.  HENT:  Head: Normocephalic and atraumatic.  Eyes: Conjunctivae are normal.  Neck: Neck supple.  Cardiovascular: Normal rate and regular rhythm.  No murmur heard. Pulmonary/Chest: Effort normal and breath sounds normal. No respiratory distress.  Abdominal: Soft. There is no tenderness.  Musculoskeletal: He exhibits no edema.  Neurological: He is alert.  Skin: Skin is warm and dry.  Psychiatric: He has a normal mood and affect.  Nursing note and vitals reviewed.    UC Treatments / Results  Labs (all labs ordered are listed, but only abnormal results are displayed) Labs Reviewed - No data to display  EKG  EKG Interpretation None       Radiology No results found.  Procedures Procedures (including critical care time)  Medications Ordered in UC Medications - No data to display   Initial Impression / Assessment and Plan / UC Course  I have reviewed the triage vital signs  and the nursing notes.  Pertinent labs & imaging results that were available during my care of the patient were reviewed by me and considered in my medical decision making (see chart for details).     Orthostatics normal.    Final Clinical Impressions(s) / UC Diagnoses   Final diagnoses:  Hypertension, unspecified type    ED Discharge Orders        Ordered    hydrochlorothiazide (HYDRODIURIL) 25 MG tablet  Daily     08/02/17 1039       Controlled Substance Prescriptions [place] Controlled Substance Registry consulted? Not Applicable   An After Visit Summary was printed and given to the patient. Meds ordered this encounter  Medications  . hydrochlorothiazide (HYDRODIURIL) 25 MG tablet    Sig: Take 1 tablet (25 mg total) by mouth daily.    Dispense:  30 tablet    Refill:  0    Order Specific Question:   Supervising Provider    Answer:   Isa RankinMURRAY, LAURA WILSON [[id number]]     Elson AreasSofia, Kavina Cantave K, [place]PA-C 08/02/17 1103

## 2017-08-02 NOTE — Discharge Instructions (Signed)
See your Physician for recheck tomorrow as scheduled

## 2017-08-02 NOTE — ED Triage Notes (Signed)
Pt has a hx of HBP that [facility], but no medication.  Pt was feeling dizzy yesterday and his BP was 140/100 and 150/100 for the last few days.  [facility] but was concerned about the dizziness he has been having.
